# Patient Record
Sex: Male | Born: 1975 | Race: Black or African American | Hispanic: No | Marital: Single | Smoking: Never smoker
Health system: Southern US, Community
[De-identification: ages and names within clinical notes are randomized; demographics above are authoritative.]

---

## 2020-01-03 ENCOUNTER — Emergency Department: Payer: Self-pay

## 2020-01-03 ENCOUNTER — Other Ambulatory Visit: Payer: Self-pay

## 2020-01-03 ENCOUNTER — Emergency Department
Admission: EM | Admit: 2020-01-03 | Discharge: 2020-01-03 | Disposition: A | Discharge status: Home / Self Care | Payer: Self-pay | Attending: Emergency Medicine | Admitting: Emergency Medicine

## 2020-01-03 DIAGNOSIS — R112 Nausea with vomiting, unspecified: Secondary | ICD-10-CM | POA: Insufficient documentation

## 2020-01-03 DIAGNOSIS — R1011 Right upper quadrant pain: Secondary | ICD-10-CM | POA: Insufficient documentation

## 2020-01-03 DIAGNOSIS — R1013 Epigastric pain: Secondary | ICD-10-CM

## 2020-01-03 DIAGNOSIS — R1084 Generalized abdominal pain: Secondary | ICD-10-CM

## 2020-01-03 LAB — COMPREHENSIVE METABOLIC PANEL
ALT: 23 U/L (ref 0–44)
AST: 17 U/L (ref 15–41)
Albumin: 5.2 g/dL — ABNORMAL HIGH (ref 3.5–5.0)
Alkaline Phosphatase: 94 U/L (ref 38–126)
Anion gap: 16 — ABNORMAL HIGH (ref 5–15)
BUN: 10 mg/dL (ref 6–20)
CO2: 26 mmol/L (ref 22–32)
Calcium: 9.4 mg/dL (ref 8.9–10.3)
Chloride: 97 mmol/L — ABNORMAL LOW (ref 98–111)
Creatinine, Ser: 1.13 mg/dL (ref 0.61–1.24)
GFR calc Af Amer: >60 mL/min (ref >60)
GFR calc non Af Amer: >60 mL/min (ref >60)
Glucose, Bld: 209 mg/dL — ABNORMAL HIGH (ref 70–99)
Potassium: 3.3 mmol/L — ABNORMAL LOW (ref 3.5–5.1)
Sodium: 139 mmol/L (ref 135–145)
Total Bilirubin: 1.6 mg/dL — ABNORMAL HIGH (ref 0.3–1.2)
Total Protein: 9 g/dL — ABNORMAL HIGH (ref 6.5–8.1)

## 2020-01-03 LAB — CBC
HCT: 44.2 % (ref 39.0–52.0)
Hemoglobin: 15.9 g/dL (ref 13.0–17.0)
MCH: 28.6 pg (ref 26.0–34.0)
MCHC: 36 g/dL (ref 30.0–36.0)
MCV: 79.5 fL — ABNORMAL LOW (ref 80.0–100.0)
Platelets: 486 10*3/uL — ABNORMAL HIGH (ref 150–400)
RBC: 5.56 MIL/uL (ref 4.22–5.81)
RDW: 11.4 % — ABNORMAL LOW (ref 11.5–15.5)
WBC: 9.8 10*3/uL (ref 4.0–10.5)
nRBC: 0 % (ref 0.0–0.2)

## 2020-01-03 LAB — LIPASE, BLOOD: Lipase: 21 U/L (ref 11–51)

## 2020-01-03 MED ORDER — LIDOCAINE VISCOUS HCL 2 % MT SOLN
15.0000 mL | Freq: Once | OROMUCOSAL | Status: AC
  Administered 2020-01-03: 15 mL via ORAL
  Filled 2020-01-03: qty 15

## 2020-01-03 MED ORDER — PANTOPRAZOLE SODIUM 40 MG IV SOLR
40.0000 mg | Freq: Once | INTRAVENOUS | Status: AC
  Administered 2020-01-03: 40 mg via INTRAVENOUS
  Filled 2020-01-03: qty 40

## 2020-01-03 MED ORDER — ALUM & MAG HYDROXIDE-SIMETH 200-200-20 MG/5ML PO SUSP
30.0000 mL | Freq: Once | ORAL | Status: AC
  Administered 2020-01-03: 30 mL via ORAL
  Filled 2020-01-03: qty 30

## 2020-01-03 MED ORDER — METOCLOPRAMIDE HCL 5 MG/ML IJ SOLN
10.0000 mg | Freq: Once | INTRAMUSCULAR | Status: AC
  Administered 2020-01-03: 10 mg via INTRAVENOUS
  Filled 2020-01-03: qty 2

## 2020-01-03 MED ORDER — ONDANSETRON 4 MG PO TBDP: 4.0000 mg | ORAL_TABLET | Freq: Three times a day (TID) | ORAL | 0 refills | Status: AC | PRN

## 2020-01-03 MED ORDER — PANTOPRAZOLE SODIUM 40 MG PO TBEC: 40.0000 mg | DELAYED_RELEASE_TABLET | Freq: Every day | ORAL | 1 refills | Status: AC

## 2020-01-03 MED ORDER — MORPHINE SULFATE (PF) 2 MG/ML IV SOLN
2.0000 mg | Freq: Once | INTRAVENOUS | Status: AC
  Administered 2020-01-03: 2 mg via INTRAVENOUS
  Filled 2020-01-03: qty 1

## 2020-01-03 MED ORDER — ONDANSETRON HCL 4 MG/2ML IJ SOLN
4.0000 mg | Freq: Once | INTRAMUSCULAR | Status: AC
  Administered 2020-01-03: 4 mg via INTRAVENOUS
  Filled 2020-01-03: qty 2

## 2020-01-03 MED ORDER — SODIUM CHLORIDE 0.9 % IV BOLUS
1000.0000 mL | Freq: Once | INTRAVENOUS | Status: AC
  Administered 2020-01-03: 1000 mL via INTRAVENOUS

## 2020-01-03 NOTE — ED Provider Notes (Signed)
[facility] Emergency Department Provider Note ____________________________________________  Time seen: 57  I have reviewed the triage vital signs and the nursing notes.  HISTORY  Chief Complaint  Abdominal Pain  HPI Alan Hall is a 44 y.o. male presents himself to the ED for evaluation of a 4 to 5-day complaint of intermittent nausea and vomiting.  Patient describes nonbloody, bilious emesis for the last several days.   He also reports some associated epigastric abdominal pain.  He notes he was recently seen in [facility] for the same symptoms, but returns today after no significant improvement. He apparently had a full work-up, including an abdominal CT scan, which only revealed kidney stones.  He denies any interim fevers, chills, or sweats.  Has no significant medical history, and takes no daily medications.  History reviewed. No pertinent past medical history.  There are no problems to display for this patient.  History reviewed. No pertinent surgical history.  Prior to Admission medications   Medication Sig Start Date End Date Taking? Authorizing Provider  ondansetron (ZOFRAN ODT) 4 MG disintegrating tablet Take 1 tablet (4 mg total) by mouth every 8 (eight) hours as needed. 01/03/20   Conleigh Heinlein, Charlesetta Ivory, PA-C  pantoprazole (PROTONIX) 40 MG tablet Take 1 tablet (40 mg total) by mouth daily. 01/03/20 03/03/20  Kylen Schliep, Charlesetta Ivory, PA-C    Allergies Patient has no allergy information on record.  No family history on file.  Social History Social History   Tobacco Use  . Smoking status: Never Smoker  . Smokeless tobacco: Never Used  Substance Use Topics  . Alcohol use: Not Currently  . Drug use: Not Currently    Review of Systems  Constitutional: Negative for fever. Eyes: Negative for visual changes. ENT: Negative for sore throat. Cardiovascular: Negative for chest pain. Respiratory: Negative for shortness of breath. Gastrointestinal:  Positive for abdominal pain, vomiting.  Denies constipation and diarrhea. Genitourinary: Negative for dysuria. Musculoskeletal: Negative for back pain. Skin: Negative for rash. Neurological: Negative for headaches, focal weakness or numbness. ____________________________________________  PHYSICAL EXAM:  VITAL SIGNS: ED Triage Vitals [01/03/20 1644]  Enc Vitals Group     BP (!) 161/106     Pulse Rate (!) 118     Resp 18     Temp 98 F (36.7 C)     Temp src      SpO2 97 %     Weight 220 lb (99.8 kg)     Height 6\' 1"  (1.854 m)     Head Circumference      Peak Flow      Pain Score 10     Pain Loc      Pain Edu?      Excl. in GC?     Constitutional: Alert and oriented. Well appearing and in no distress. Head: Normocephalic and atraumatic. Eyes: Conjunctivae are normal. Normal extraocular movements Cardiovascular: Normal rate, regular rhythm. Normal distal pulses. Respiratory: Normal respiratory effort. No wheezes/rales/rhonchi. Gastrointestinal: Soft and nontender. No distention, rebound, guarding, or rigidity. Normal bowel sounds. No CVA tenderness elicited.  Musculoskeletal: Nontender with normal range of motion in all extremities.  Neurologic:  Normal gait without ataxia. Normal speech and language. No gross focal neurologic deficits are appreciated. Skin:  Skin is warm, dry and intact. No rash noted. Psychiatric: Mood and affect are normal. Patient exhibits appropriate insight and judgment. ____________________________________________   LABS (pertinent positives/negatives) Labs Reviewed  COMPREHENSIVE METABOLIC PANEL - Abnormal; Notable for the following components:  Result Value   Potassium 3.3 (*)    Chloride 97 (*)    Glucose, Bld 209 (*)    Total Protein 9.0 (*)    Albumin 5.2 (*)    Total Bilirubin 1.6 (*)    Anion gap 16 (*)    All other components within normal limits  CBC - Abnormal; Notable for the following components:   MCV 79.5 (*)    RDW 11.4 (*)     Platelets 486 (*)    All other components within normal limits  LIPASE, BLOOD  URINALYSIS, COMPLETE (UACMP) WITH MICROSCOPIC  ____________________________________________   RADIOLOGY  [place] ABD - Limited  IMPRESSION: 1. Increased hepatic echogenicity with a coarsened echotexture and suggestion of some mild surface nodularity. Findings are nonspecific but can be seen in the setting of fatty infiltration, intrinsic liver disease and/or cirrhosis. 2. Otherwise unremarkable right upper quadrant ultrasound. ____________________________________________  PROCEDURES  Zofran 4 mg IVP Pantoprazole 40 gm IVP NS 1000 bolus  GI Cocktail PO Morphine 2 mg IVP Metoclopramide 10 mg IVP  Procedures ____________________________________________  INITIAL IMPRESSION / ASSESSMENT AND PLAN / ED COURSE  Differential diagnosis includes, but is not limited to, biliary disease (biliary colic, acute cholecystitis, cholangitis, choledocholithiasis, etc), intrathoracic causes for epigastric abdominal pain including ACS, gastritis, duodenitis, pancreatitis, small bowel or large bowel obstruction, abdominal aortic aneurysm, hernia, and ulcer(s).  Patient with ED evaluation of a 4 to 5-day complaint of intermittent nausea with vomiting and epigastric abdominal pain.  Patient was evaluated at [facility] 2 days earlier with the work-up including normal CT by report.  Care everywhere is currently unavailable for review of records.  Patient's course has been stable here in the ED.  Labs been reassuring as is no signs of any acute elevation in his lipase or white blood cell.  Liver function enzymes are normal and he is without signs of dehydration or toxic appearance.  His abdominal ultrasound not revealing any acute cholecystitis, gallstones, or other hepatic abnormalities.  Patient has had improvement of his symptoms after GI cocktail, pain medicines, nausea medicines, and a fluid bolus in the ED.  His symptoms are  likely consistent with a gastritis versus GERD picture.  He will be discharged with a prescription for PPI as well as nausea medicine take as directed but he is encouraged to establish care with a local primary provider, and further seek definitive care with a GI specialist but return precautions have been discussed the patient will return as needed.  Deondrick Searls was evaluated in Emergency Department on 01/03/2020 for the symptoms described in the history of present illness. He was evaluated in the context of the global COVID-19 pandemic, which necessitated consideration that the patient might be at risk for infection with the SARS-CoV-2 virus that causes COVID-19. Institutional protocols and algorithms that pertain to the evaluation of patients at risk for COVID-19 are in a state of rapid change based on information released by regulatory bodies including the CDC and federal and state organizations. These policies and algorithms were followed during the patient's care in the ED. ____________________________________________  FINAL CLINICAL IMPRESSION(S) / ED DIAGNOSES  Final diagnoses:  Generalized abdominal pain  Epigastric abdominal pain  RUQ abdominal pain      Lianne Carreto, Charlesetta Ivory, PA-C 01/03/20 2318    Phineas Semen, MD 01/05/20 [phone]

## 2020-01-03 NOTE — Discharge Instructions (Signed)
Your exam and labs are essentially normal at this time. Your Ultrasound did not reveal any abnormality with your gallbladder. Take the prescription meds as directed. You should select a primary provider and work to establish health insurance coverage. You will need to been seen by a Gastroenterologist for definitive treatment.

## 2020-01-03 NOTE — ED Triage Notes (Signed)
Pt comes via POV from home with c/o abdominal pain. Pt states that he was just seen in [facility] for same and thought it would get better. Pt states it hasn't  Pt states N/V for last 5 days.

## 2020-01-03 NOTE — ED Notes (Signed)
Pt states having nv for the last several days and that he hasn't been eating/drinking much. Denies fevesr and diarrhea.

## 2021-07-11 IMAGING — US US ABDOMEN LIMITED
1 series · 14 of 25 positions shown · non-contrast
Comparison: None.

CLINICAL DATA: Epigastric and right upper quadrant pain for 1 week

EXAM:
ULTRASOUND ABDOMEN LIMITED RIGHT UPPER QUADRANT

[Series 1: us abdomen limited ruq · 14 of 42 slices shown]
[im 1/42]
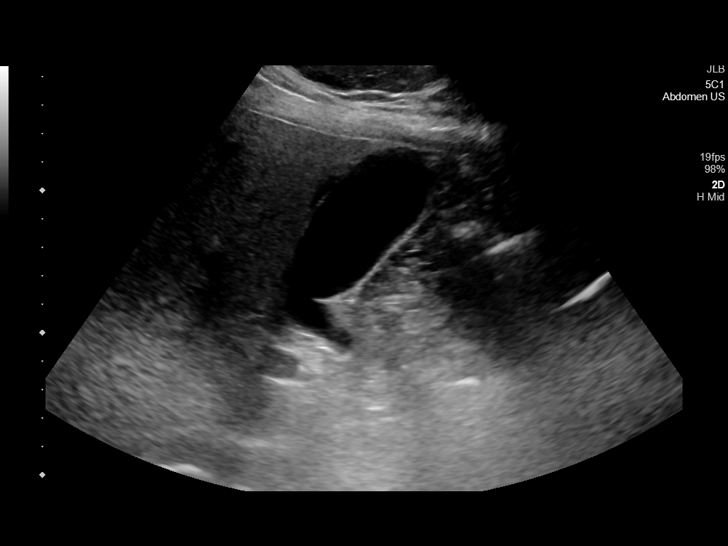
[im 4/42]
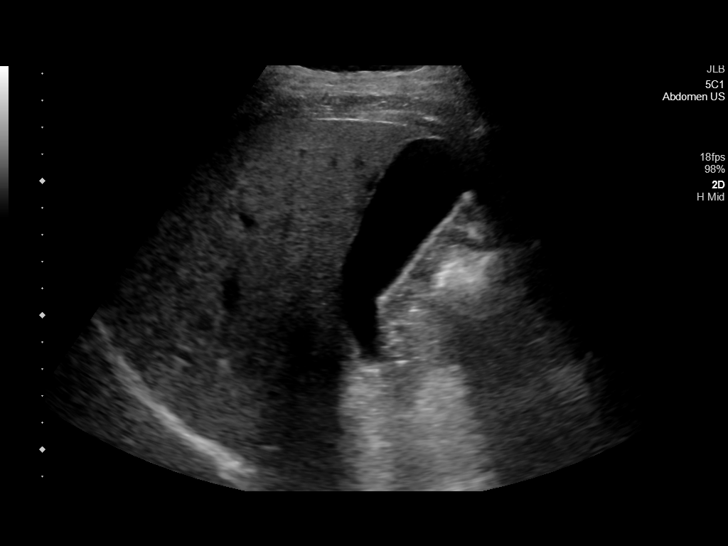
[im 7/42]
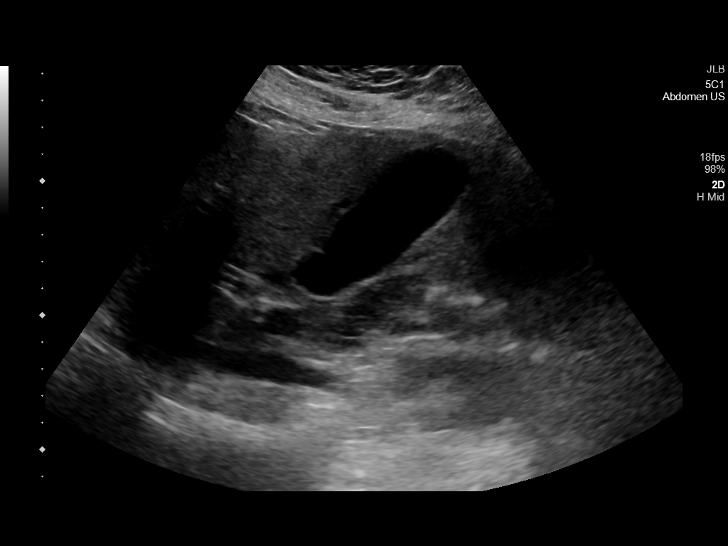
[im 11/42]
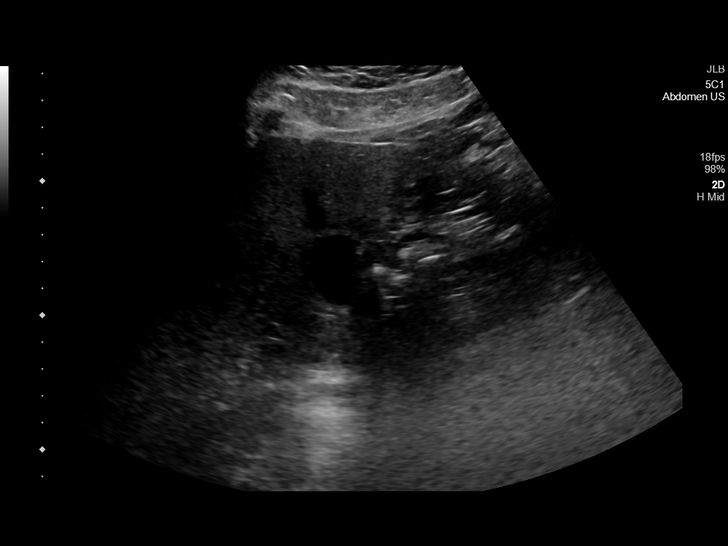
[im 14/42]
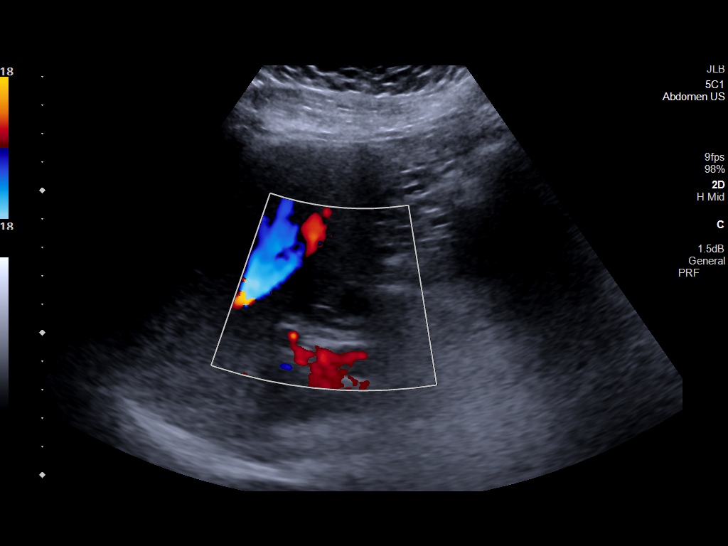
[im 16/42]
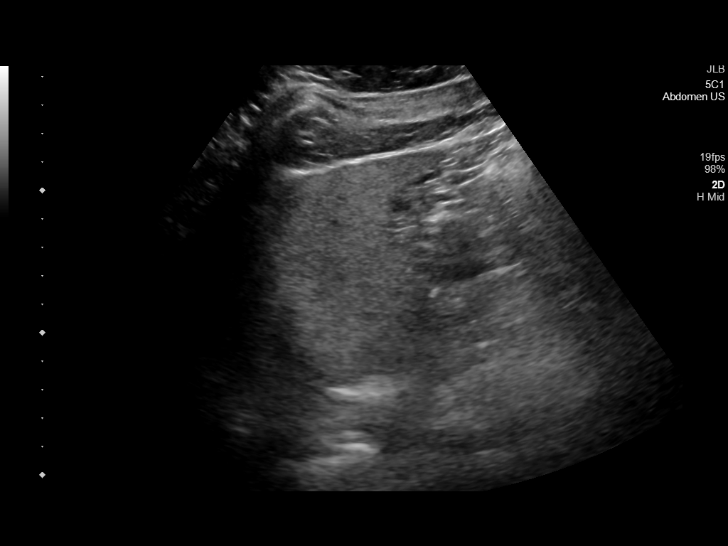
[im 19/42]
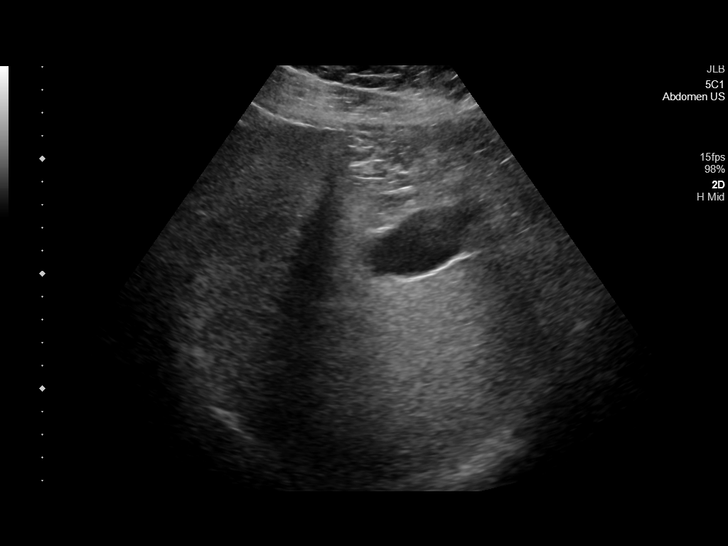
[im 23/42]
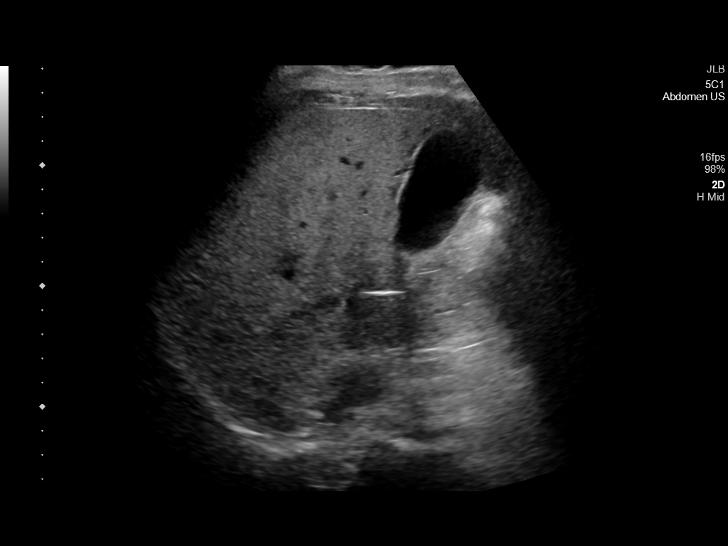
[im 26/42]
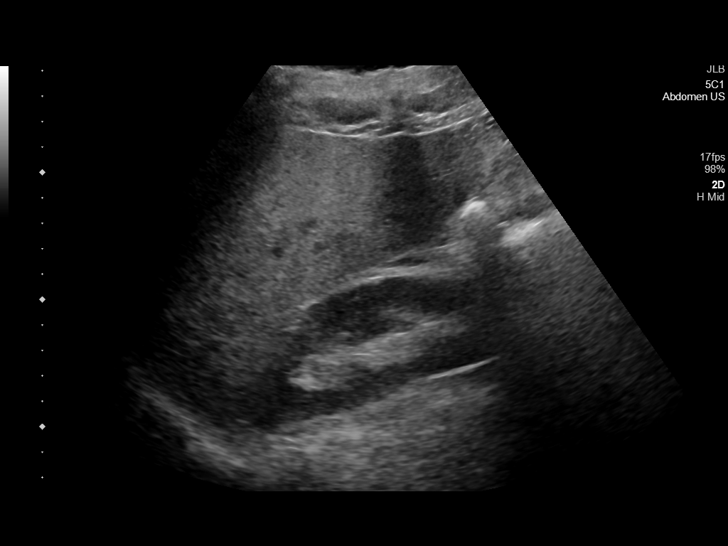
[im 28/42]
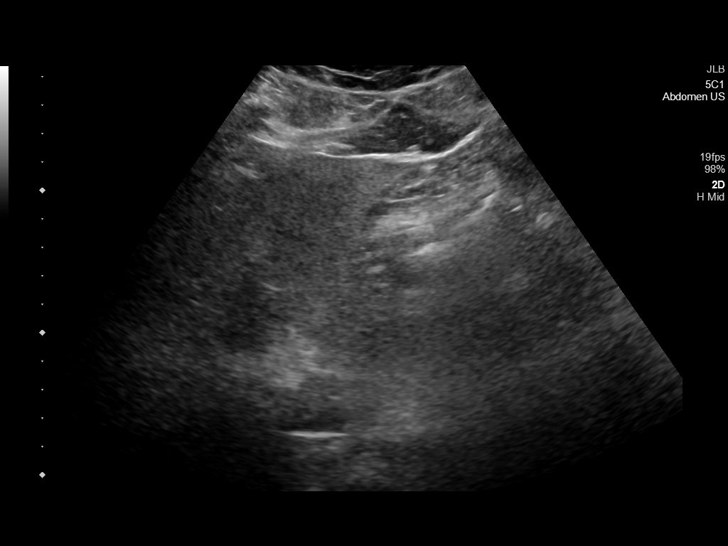
[im 31/42]
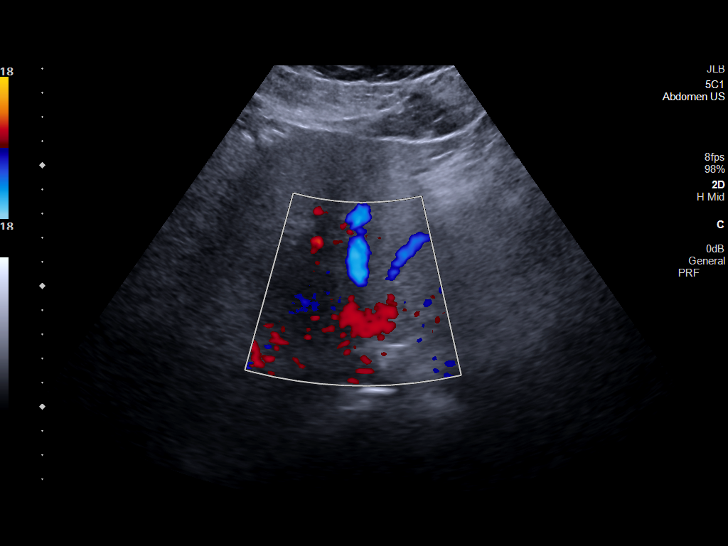
[im 35/42]
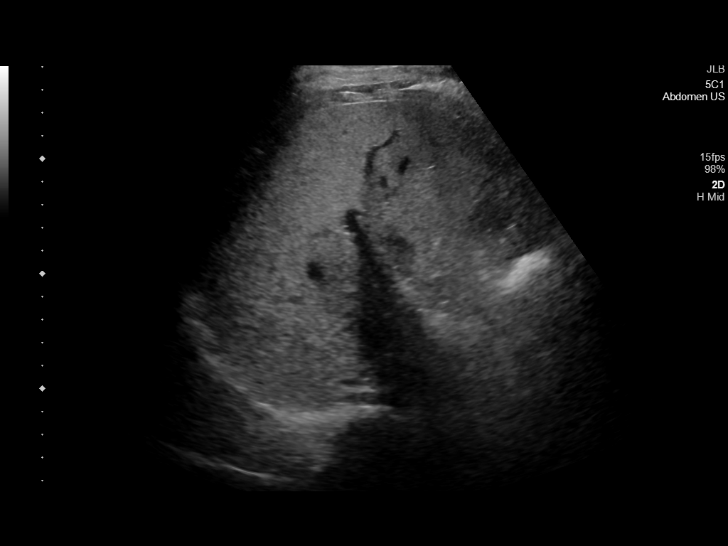
[im 38/42]
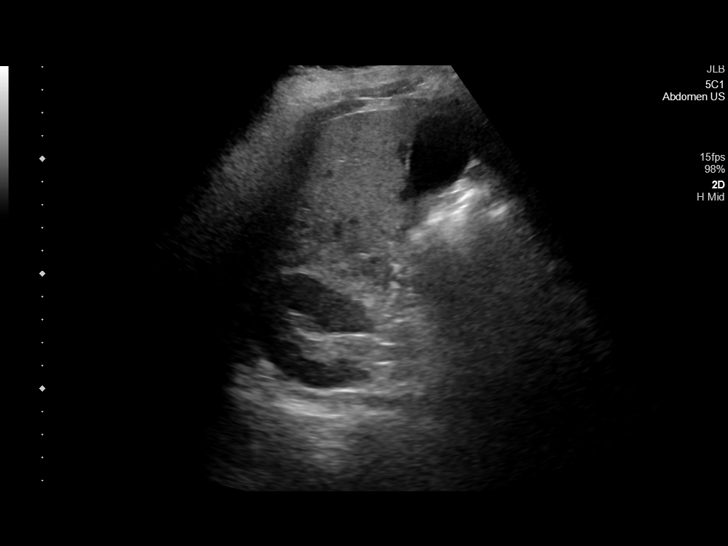
[im 42/42]
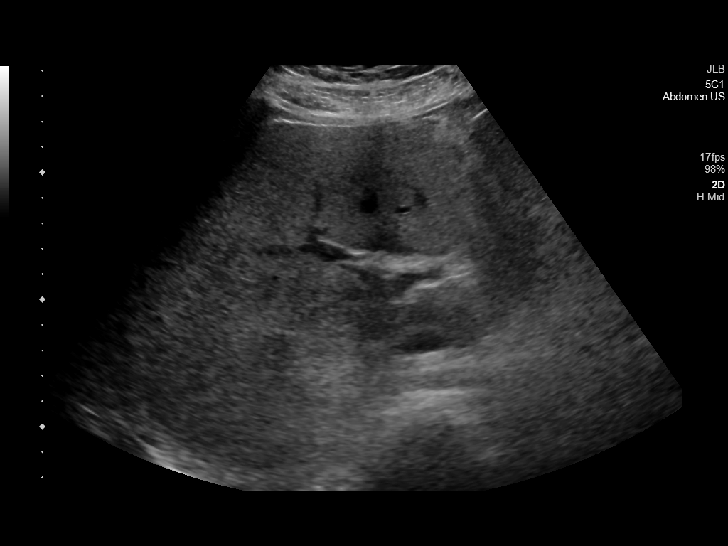

[14 of 25 positions shown; findings below may reference images not displayed]

FINDINGS: Gallbladder:

No gallstones or wall thickening visualized. No sonographic Murphy
sign noted by sonographer.

Common bile duct:

Diameter: 2 mm, nondilated

Liver:

Increased hepatic echogenicity with some mildly coarsened
echotexture. Suspect some mild surface nodularity as well. No focal
liver lesion. Portal vein is patent on color Doppler imaging with
normal direction of blood flow towards the liver.

Other: None.
IMPRESSION: 1. Increased hepatic echogenicity with a coarsened echotexture and
suggestion of some mild surface nodularity. Findings are nonspecific
but can be seen in the setting of fatty infiltration, intrinsic
liver disease and/or cirrhosis.
2. Otherwise unremarkable right upper quadrant ultrasound.
